# Patient Record
Sex: Female | Born: 2000 | Race: White | Hispanic: No | Marital: Single | Smoking: Never smoker
Health system: Southern US, Community
[De-identification: ages and names within clinical notes are randomized; demographics above are authoritative.]

## PROBLEM LIST (undated history)

## (undated) DIAGNOSIS — J45909 Unspecified asthma, uncomplicated: Secondary | ICD-10-CM

## (undated) DIAGNOSIS — F419 Anxiety disorder, unspecified: Secondary | ICD-10-CM

## (undated) DIAGNOSIS — L709 Acne, unspecified: Secondary | ICD-10-CM

---

## 2020-08-02 ENCOUNTER — Encounter: Payer: Self-pay | Admitting: *Deleted

## 2020-08-02 ENCOUNTER — Ambulatory Visit
Admission: EM | Admit: 2020-08-02 | Discharge: 2020-08-02 | Disposition: A | Discharge status: Home / Self Care | Payer: 59

## 2020-08-02 ENCOUNTER — Other Ambulatory Visit: Payer: Self-pay

## 2020-08-02 DIAGNOSIS — R0602 Shortness of breath: Secondary | ICD-10-CM | POA: Diagnosis not present

## 2020-08-02 DIAGNOSIS — J209 Acute bronchitis, unspecified: Secondary | ICD-10-CM | POA: Diagnosis not present

## 2020-08-02 DIAGNOSIS — R059 Cough, unspecified: Secondary | ICD-10-CM | POA: Diagnosis not present

## 2020-08-02 HISTORY — DX: Unspecified asthma, uncomplicated: J45.909

## 2020-08-02 HISTORY — DX: Acne, unspecified: L70.9

## 2020-08-02 HISTORY — DX: Anxiety disorder, unspecified: F41.9

## 2020-08-02 MED ORDER — BENZONATATE 100 MG PO CAPS: 100.0000 mg | ORAL_CAPSULE | Freq: Three times a day (TID) | ORAL | 0 refills | Status: DC

## 2020-08-02 MED ORDER — ALBUTEROL SULFATE HFA 108 (90 BASE) MCG/ACT IN AERS: 2.0000 | INHALATION_SPRAY | RESPIRATORY_TRACT | 0 refills | Status: AC | PRN

## 2020-08-02 MED ORDER — PREDNISONE 10 MG (21) PO TBPK: ORAL_TABLET | Freq: Every day | ORAL | 0 refills | Status: AC

## 2020-08-02 NOTE — ED Provider Notes (Signed)
[facility]   [id number] 08/02/20 Arrival Time: 0932   SUBJECTIVE:  Sandra Warner is a 19 y.o. female who presents with complaint of nasal congestion, post-nasal drainage, and a persistent cough. Onset abrupt approximately 1 week ago. Overall fatigued. SOB: mild. Wheezing: moderate. Has positive history of Covid. Has not completed Covid vaccines. Denies fever, nausea, vomiting, diarrhea, rash, other symptoms OTC treatment: has been using expired albuterol inhaler.  Social History   Tobacco Use  Smoking Status Never Smoker  Smokeless Tobacco Never Used    ROS: As per HPI.   OBJECTIVE:  Vitals:   08/02/20 0952  BP: 112/78  Pulse: 74  Resp: 16  Temp: 99.3 F (37.4 C)  TempSrc: Oral  SpO2: 98%     General appearance: alert; no distress HEENT: nasal congestion; clear runny nose; throat irritation secondary to post-nasal drainage Neck: supple without LAD Lungs: Wheezing to bilateral bases Skin: warm and dry Psychological: alert and cooperative; normal mood and affect  No results found for this or any previous visit.  Labs Reviewed - No data to display  No results found.  No Known Allergies  Past Medical History:  Diagnosis Date  . Acne   . Anxiety   . Asthma    Social History   Socioeconomic History  . Marital status: Single    Spouse name: Not on file  . Number of children: Not on file  . Years of education: Not on file  . Highest education level: Not on file  Occupational History  . Not on file  Tobacco Use  . Smoking status: Never Smoker  . Smokeless tobacco: Never Used  Substance and Sexual Activity  . Alcohol use: Not on file  . Drug use: Not on file  . Sexual activity: Not on file  Other Topics Concern  . Not on file  Social History Narrative  . Not on file   Social Determinants of Health   Financial Resource Strain:   . Difficulty of Paying Living Expenses: Not on file  Food Insecurity:   . Worried About Brewing technologist in the Last Year: Not on file  . Ran Out of Food in the Last Year: Not on file  Transportation Needs:   . Lack of Transportation (Medical): Not on file  . Lack of Transportation (Non-Medical): Not on file  Physical Activity:   . Days of Exercise per Week: Not on file  . Minutes of Exercise per Session: Not on file  Stress:   . Feeling of Stress : Not on file  Social Connections:   . Frequency of Communication with Friends and Family: Not on file  . Frequency of Social Gatherings with Friends and Family: Not on file  . Attends Religious Services: Not on file  . Active Member of Clubs or Organizations: Not on file  . Attends Banker Meetings: Not on file  . Marital Status: Not on file  Intimate Partner Violence:   . Fear of Current or Ex-Partner: Not on file  . Emotionally Abused: Not on file  . Physically Abused: Not on file  . Sexually Abused: Not on file   History reviewed. No pertinent family history.   ASSESSMENT & PLAN:  1. Acute bronchitis, unspecified organism   2. Cough   3. SOB (shortness of breath)     Meds ordered this encounter  Medications  . albuterol (VENTOLIN HFA) 108 (90 Base) MCG/ACT inhaler    Sig: Inhale 2 puffs into the lungs every  4 (four) hours as needed for wheezing or shortness of breath.    Dispense:  18 g    Refill:  0    Order Specific Question:   Supervising Provider    Answer:   Merrilee Jansky [id number]  . benzonatate (TESSALON) 100 MG capsule    Sig: Take 1 capsule (100 mg total) by mouth every 8 (eight) hours.    Dispense:  21 capsule    Refill:  0    Order Specific Question:   Supervising Provider    Answer:   Merrilee Jansky [id number]  . predniSONE (STERAPRED UNI-PAK 21 TAB) 10 MG (21) TBPK tablet    Sig: Take by mouth daily for 6 days. Take 6 tablets on day 1, 5 tablets on day 2, 4 tablets on day 3, 3 tablets on day 4, 2 tablets on day 5, 1 tablet on day 6    Dispense:  21 tablet    Refill:  0    Order  Specific Question:   Supervising Provider    Answer:   Merrilee Jansky [[id number]]   Prescribed Tessalon Perles for cough Prescribed steroid taper Prescribed albuterol inhaler OTC symptom care as needed. Will plan f/u with PCP or here as needed.  Reviewed expectations re: course of current medical issues. Questions answered. Outlined signs and symptoms indicating need for more acute intervention. Patient verbalized understanding. After Visit Summary given.           Moshe Cipro, NP 08/02/20 1010

## 2020-08-02 NOTE — ED Triage Notes (Signed)
Patient reports cough and asthma x 4 days. States tested positive for COVID 3 weeks ago--symptoms had subsided. No fever. States that the coughing and shortness of breath progresses through the day. Patient with history of asthma--patient using rescue inhaler. Does have nebulizer at home--has medication for it but thinks may be expired.

## 2020-08-02 NOTE — Discharge Instructions (Signed)
I have sent in an albuterol inhaler for you to use.  You may use 2 puffs every 4-6 hours as needed for wheezing, cough, shortness of breath  I have sent in a prednisone taper for you to take for 6 days. 6 tablets on day one, 5 tablets on day two, 4 tablets on day three, 3 tablets on day four, 2 tablets on day five, and 1 tablet on day six.  I have also sent in [facility] for you to take.  You may take 1 capsule every 8 hours as needed for cough  Follow-up with this office or with primary care if symptoms are persisting  Follow-up with the ER for high fever, shortness of breath, trouble swallowing, other concerning symptoms

## 2020-09-09 ENCOUNTER — Ambulatory Visit (INDEPENDENT_AMBULATORY_CARE_PROVIDER_SITE_OTHER): Payer: 59

## 2020-09-09 ENCOUNTER — Ambulatory Visit
Admission: EM | Admit: 2020-09-09 | Discharge: 2020-09-09 | Disposition: A | Discharge status: Home / Self Care | Payer: 59 | Attending: Family Medicine | Admitting: Family Medicine

## 2020-09-09 DIAGNOSIS — R059 Cough, unspecified: Secondary | ICD-10-CM | POA: Diagnosis not present

## 2020-09-09 DIAGNOSIS — R0602 Shortness of breath: Secondary | ICD-10-CM

## 2020-09-09 MED ORDER — BENZONATATE 100 MG PO CAPS: 100.0000 mg | ORAL_CAPSULE | Freq: Three times a day (TID) | ORAL | 0 refills | Status: AC

## 2020-09-09 NOTE — Discharge Instructions (Addendum)
Your x-ray did not show any pneumonia. Recommend adding Zyrtec daily to your medicines I have refilled the Tessalon Perles to use  every 8 hours as needed for cough. Albuterol as needed Follow up as needed for continued or worsening symptoms

## 2020-09-09 NOTE — ED Triage Notes (Signed)
Pt presents to Urgent Care with c/o cough x 1 week. She reports having bronchitis approx 3 weeks ago, which resolved temporarily. Pt has asthma. Pt w/ no known COVID exposure; has been fully vaccinated and had COVID in 06/2020.

## 2020-09-10 NOTE — ED Provider Notes (Signed)
[facility]    CSN: [id number] Arrival date & time: 09/09/20  1443      History   Chief Complaint Chief Complaint  Patient presents with  . Cough    HPI Sandra Warner is a 19 y.o. female.   Patient is a 19 year old female who presents today for approximate 1 week of cough.  Reporting having bronchitis approximate 3 weeks ago and was treated with prednisone which seemed to resolve but now has returned.  Does have a history of asthma.  No known Covid exposure.  Fully vaccinated.  No shortness of breath, chest pain, fevers.     Past Medical History:  Diagnosis Date  . Acne   . Anxiety   . Asthma     There are no problems to display for this patient.   History reviewed. No pertinent surgical history.  OB History   No obstetric history on file.      Home Medications    Prior to Admission medications   Medication Sig Start Date End Date Taking? Authorizing Provider  albuterol (PROVENTIL) (2.5 MG/3ML) 0.083% nebulizer solution Take 2.5 mg by nebulization every 6 (six) hours as needed for wheezing or shortness of breath.    [provider]  albuterol (VENTOLIN HFA) 108 (90 Base) MCG/ACT inhaler Inhale 2 puffs into the lungs every 4 (four) hours as needed for wheezing or shortness of breath. 08/02/20   Moshe Cipro, NP  benzonatate (TESSALON) 100 MG capsule Take 1 capsule (100 mg total) by mouth every 8 (eight) hours. 09/09/20   Chazlyn Cude, Gloris Manchester A, NP  busPIRone (BUSPAR) 30 MG tablet Take 55 mg by mouth 2 (two) times daily.    [provider]  montelukast (SINGULAIR) 10 MG tablet Take 10 mg by mouth at bedtime.    [provider]  spironolactone (ALDACTONE) 100 MG tablet Take 200 mg by mouth daily.    [provider]    Family History Family History  Problem Relation Age of Onset  . Healthy Mother   . Healthy Father     Social History Social History   Tobacco Use  . Smoking status: Never Smoker  . Smokeless  tobacco: Never Used  Substance Use Topics  . Alcohol use: Yes    Comment: weekends  . Drug use: Not Currently     Allergies   Patient has no known allergies.   Review of Systems Review of Systems   Physical Exam Triage Vital Signs ED Triage Vitals  Enc Vitals Group     BP 09/09/20 1540 110/74     Pulse Rate 09/09/20 1540 99     Resp 09/09/20 1540 18     Temp 09/09/20 1540 97.8 F (36.6 C)     Temp Source 09/09/20 1540 Temporal     SpO2 09/09/20 1540 98 %     Weight 09/09/20 1537 160 lb (72.6 kg)     Height --      Head Circumference --      Peak Flow --      Pain Score 09/09/20 1536 0     Pain Loc --      Pain Edu? --      Excl. in GC? --    No data found.  Updated Vital Signs BP 110/74 (BP Location: Left Arm)   Pulse 99   Temp 97.8 F (36.6 C) (Temporal)   Resp 18   Wt 160 lb (72.6 kg)   LMP 08/12/2020 (Approximate)   SpO2 98%  Visual Acuity Right Eye Distance:   Left Eye Distance:   Bilateral Distance:    Right Eye Near:   Left Eye Near:    Bilateral Near:     Physical Exam Vitals and nursing note reviewed.  Constitutional:      General: She is not in acute distress.    Appearance: Normal appearance. She is not ill-appearing, toxic-appearing or diaphoretic.  HENT:     Head: Normocephalic.     Right Ear: Tympanic membrane and ear canal normal.     Left Ear: Tympanic membrane and ear canal normal.     Nose: Nose normal.     Mouth/Throat:     Pharynx: Oropharynx is clear.  Eyes:     Conjunctiva/sclera: Conjunctivae normal.  Cardiovascular:     Rate and Rhythm: Normal rate and regular rhythm.  Pulmonary:     Effort: Pulmonary effort is normal. No respiratory distress.     Breath sounds: No stridor. No wheezing, rhonchi or rales.     Comments: Decreased in right lung base Chest:     Chest wall: No tenderness.  Musculoskeletal:        General: Normal range of motion.     Cervical back: Normal range of motion.  Skin:    General: Skin is  warm and dry.     Findings: No rash.  Neurological:     Mental Status: She is alert.  Psychiatric:        Mood and Affect: Mood normal.      UC Treatments / Results  Labs (all labs ordered are listed, but only abnormal results are displayed) Labs Reviewed - No data to display  EKG   Radiology DG Chest 2 View  Result Date: 09/09/2020 CLINICAL DATA:  Cough, SOB, fever EXAM: CHEST - 2 VIEW COMPARISON:  None. FINDINGS: The cardiomediastinal contours are within normal limits. The lungs are clear. No pneumothorax or pleural effusion. No acute finding in the visualized skeleton. IMPRESSION: No acute cardiopulmonary process. Electronically Signed   By: Emmaline Kluver M.D.   On: 09/09/2020 16:24    Procedures Procedures (including critical care time)  Medications Ordered in UC Medications - No data to display  Initial Impression / Assessment and Plan / UC Course  I have reviewed the triage vital signs and the nursing notes.  Pertinent labs & imaging results that were available during my care of the patient were reviewed by me and considered in my medical decision making (see chart for details).     Cough X-ray without any acute findings.  Most likely viral versus allergy related. Refill Tessalon Perles to use as needed.  Recommended Zyrtec daily. Albuterol as needed Follow up as needed for continued or worsening symptoms  Final Clinical Impressions(s) / UC Diagnoses   Final diagnoses:  Cough     Discharge Instructions     Your x-ray did not show any pneumonia. Recommend adding Zyrtec daily to your medicines I have refilled the Tessalon Perles to use  every 8 hours as needed for cough. Albuterol as needed Follow up as needed for continued or worsening symptoms      ED Prescriptions    Medication Sig Dispense Auth. Provider   benzonatate (TESSALON) 100 MG capsule Take 1 capsule (100 mg total) by mouth every 8 (eight) hours. 21 capsule Muaz Shorey A, NP      PDMP not reviewed this encounter.   Janace Aris, NP 09/10/20 1201

## 2021-10-23 IMAGING — DX DG CHEST 2V
2 series · 2 of 2 positions shown · non-contrast
Comparison: None.

CLINICAL DATA: Cough, SOB, fever

EXAM:
CHEST - 2 VIEW

[chest pa]
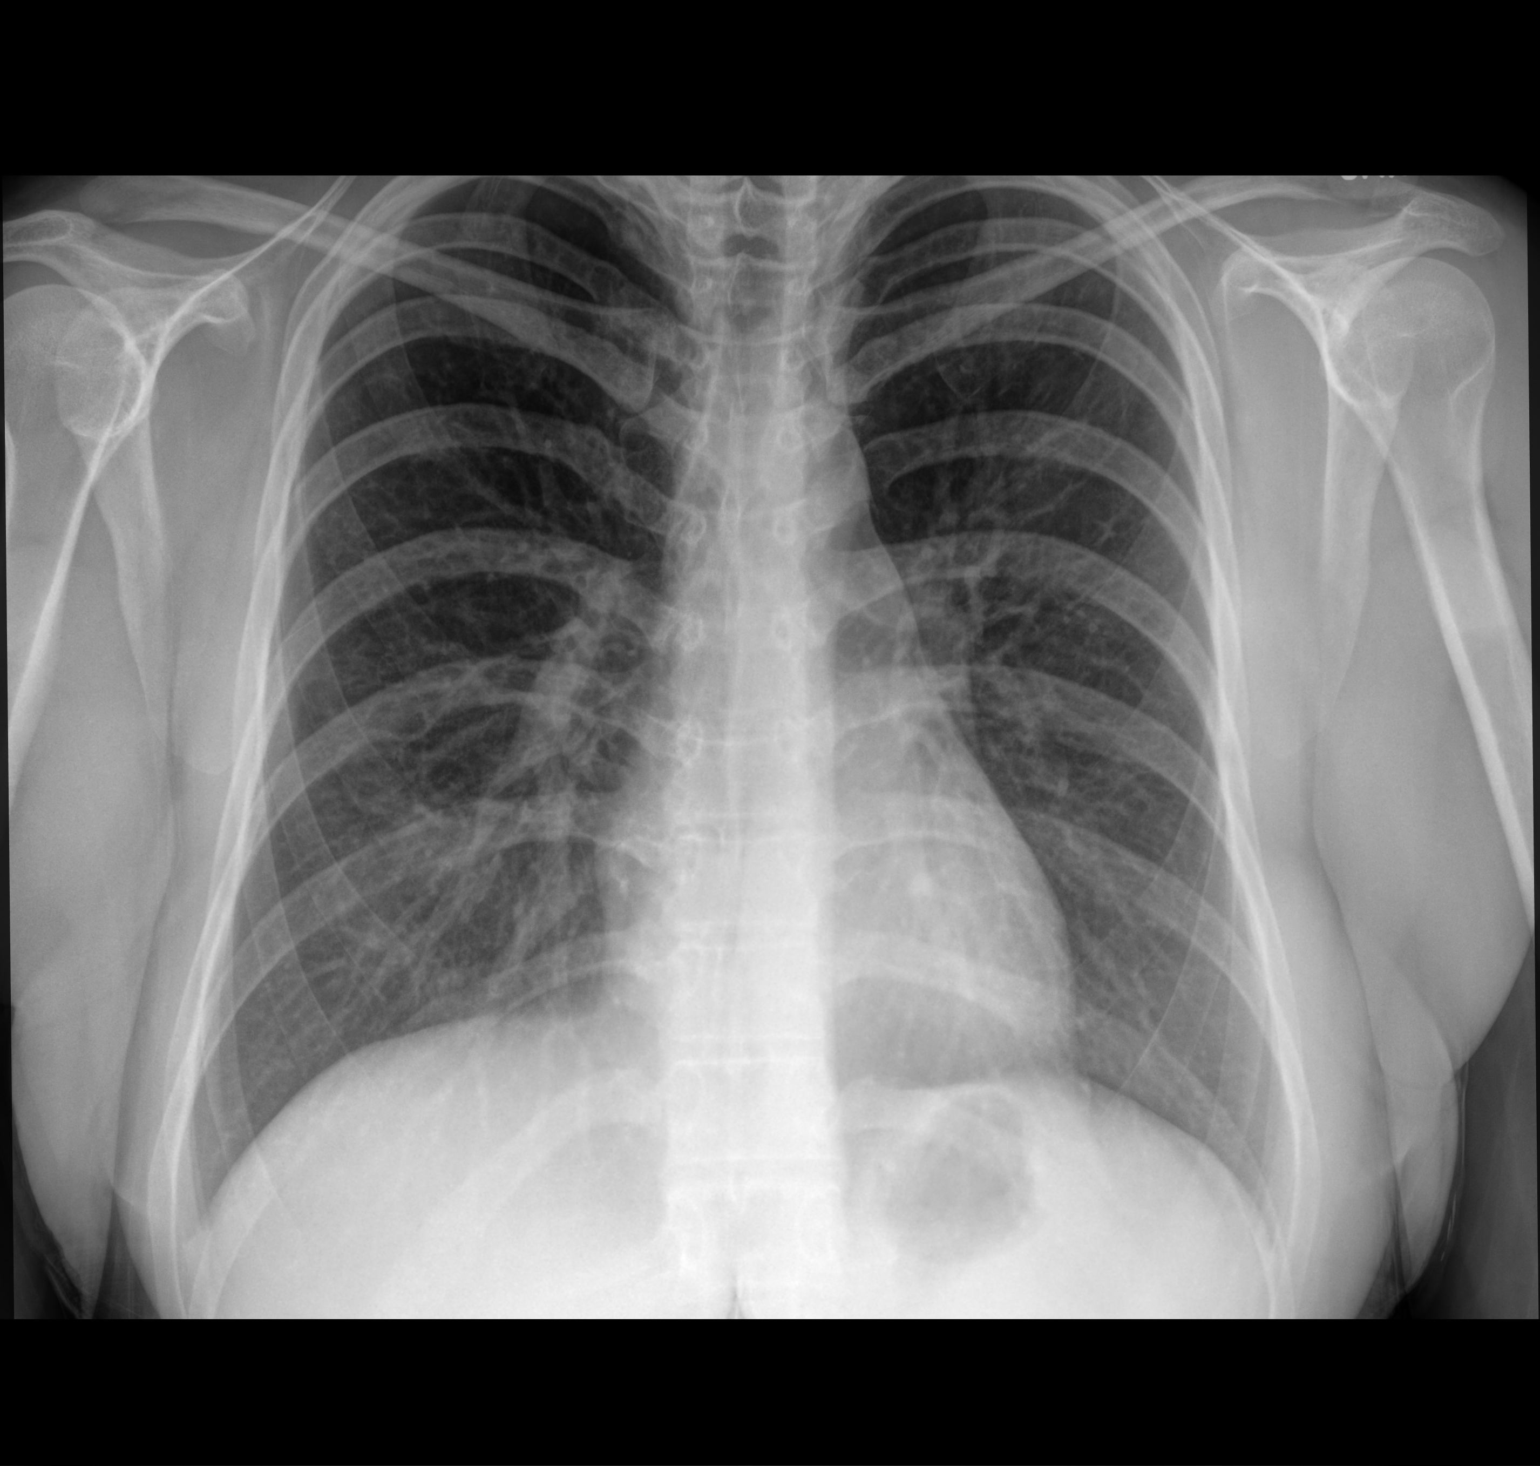

[chest lat]
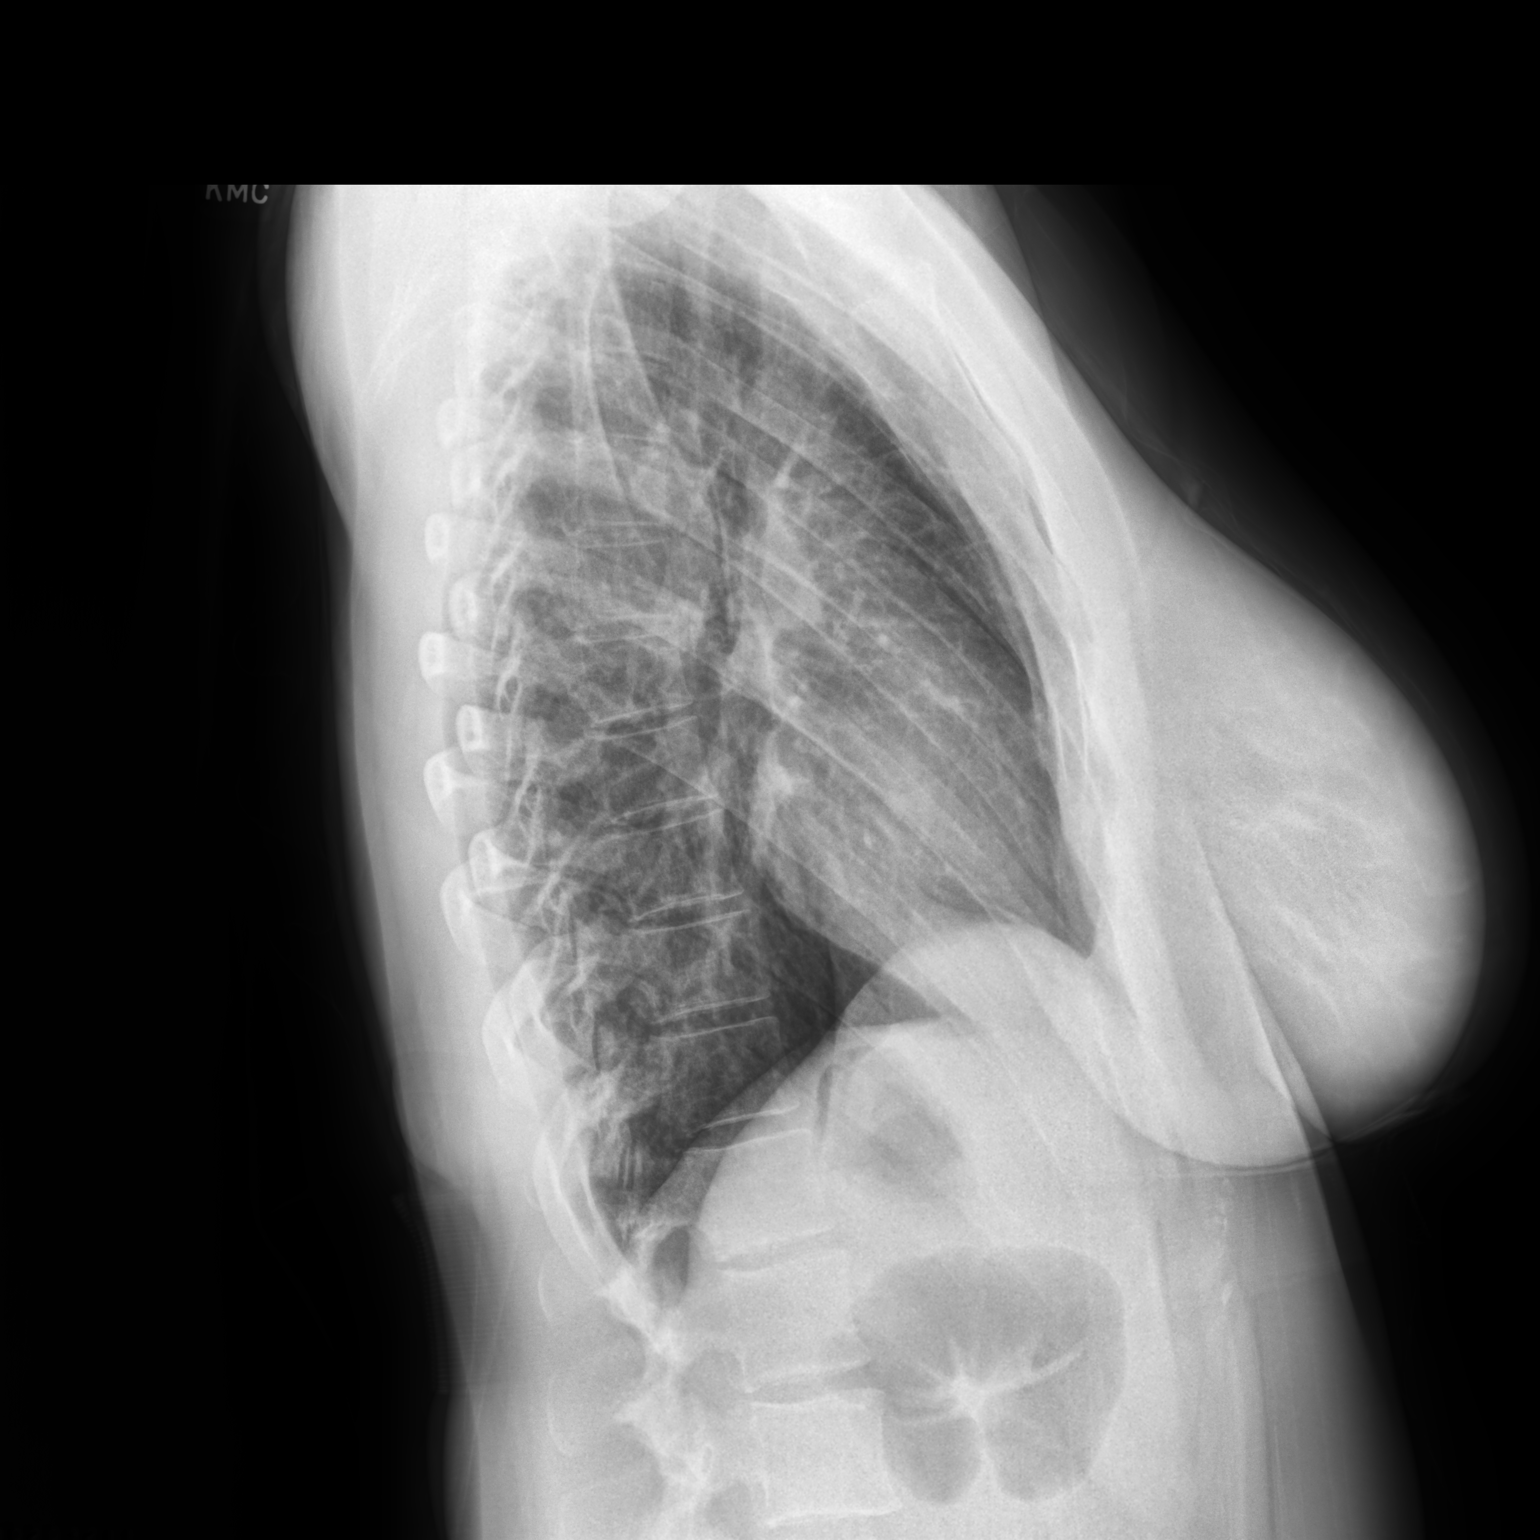

[2 of 2 positions shown; findings below may reference images not displayed]

FINDINGS: The cardiomediastinal contours are within normal limits. The lungs
are clear. No pneumothorax or pleural effusion. No acute finding in
the visualized skeleton.
IMPRESSION: No acute cardiopulmonary process.
